# Patient Record
Sex: Female | Born: 2005 | Race: White | Hispanic: Yes | Marital: Single | Smoking: Never smoker
Health system: Southern US, Community
[De-identification: ages and names within clinical notes are randomized; demographics above are authoritative.]

## PROBLEM LIST (undated history)

## (undated) DIAGNOSIS — T7840XA Allergy, unspecified, initial encounter: Secondary | ICD-10-CM

## (undated) DIAGNOSIS — J45909 Unspecified asthma, uncomplicated: Secondary | ICD-10-CM

## (undated) HISTORY — DX: Allergy, unspecified, initial encounter: T78.40XA

---
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
## 2024-04-28 ENCOUNTER — Ambulatory Visit: Admitting: Nurse Practitioner

## 2024-05-20 ENCOUNTER — Encounter: Payer: Self-pay | Admitting: Obstetrics & Gynecology

## 2024-05-22 ENCOUNTER — Telehealth: Payer: Self-pay

## 2024-05-22 ENCOUNTER — Ambulatory Visit (INDEPENDENT_AMBULATORY_CARE_PROVIDER_SITE_OTHER): Admitting: Nurse Practitioner

## 2024-05-22 ENCOUNTER — Encounter: Payer: Self-pay | Admitting: Nurse Practitioner

## 2024-05-22 VITALS — BP 116/76 | HR 73 | Temp 98.2°F | Ht 71.02 in | Wt 217.0 lb

## 2024-05-22 DIAGNOSIS — J45909 Unspecified asthma, uncomplicated: Secondary | ICD-10-CM | POA: Diagnosis not present

## 2024-05-22 DIAGNOSIS — R5383 Other fatigue: Secondary | ICD-10-CM

## 2024-05-22 DIAGNOSIS — Z1159 Encounter for screening for other viral diseases: Secondary | ICD-10-CM

## 2024-05-22 DIAGNOSIS — Z114 Encounter for screening for human immunodeficiency virus [HIV]: Secondary | ICD-10-CM | POA: Diagnosis not present

## 2024-05-22 DIAGNOSIS — J45990 Exercise induced bronchospasm: Secondary | ICD-10-CM

## 2024-05-22 DIAGNOSIS — J189 Pneumonia, unspecified organism: Secondary | ICD-10-CM

## 2024-05-22 MED ORDER — CETIRIZINE HCL 10 MG PO TABS: 10.0000 mg | ORAL_TABLET | Freq: Every day | ORAL | 1 refills | Status: AC

## 2024-05-22 MED ORDER — ALBUTEROL SULFATE (SENSOR) 108 (90 BASE) MCG/ACT IN AEPB: 2.0000 | INHALATION_SPRAY | RESPIRATORY_TRACT | 3 refills | Status: AC | PRN

## 2024-05-22 MED ORDER — BUDESONIDE-FORMOTEROL FUMARATE 160-4.5 MCG/ACT IN AERO: 2.0000 | INHALATION_SPRAY | Freq: Two times a day (BID) | RESPIRATORY_TRACT | 3 refills | Status: AC

## 2024-05-22 MED ORDER — MONTELUKAST SODIUM 10 MG PO TABS: 10.0000 mg | ORAL_TABLET | Freq: Every day | ORAL | 1 refills | Status: AC

## 2024-05-22 MED ORDER — FLUTICASONE PROPIONATE 50 MCG/ACT NA SUSP: 1.0000 | Freq: Every day | NASAL | 3 refills | Status: AC

## 2024-05-22 NOTE — Progress Notes (Signed)
 New Patient Office Visit  Subjective   Patient ID: Ann Patrick, female    DOB: Jun 30, 2006  Age: 18 y.o. MRN: [id number]  CC:  Chief Complaint  Patient presents with   Establish Care    HPI Ann Patrick presents to establish care accompanied by her mom.  She plays volleyball and is attending college in [place] .  She has a history of exercise-induced asthma and allergies.  She is followed by [facility] in [place].  She has hypersensitivity to motion and experience motion sickness in the car.    She patient would like to have sports physical completed.  She has a history of pneumonia few days ago.  Has been taking antibiotic and would like to get a repeat chest x-ray.  She has been experiencing fatigue and tiredness and would like to get blood work done.   Health Maintenance  Topic Date Due   Pneumococcal Vaccine 34-56 Years old (2 of 2 - PPSV23, PCV20, or PCV21) 01/30/2011   COVID-19 Vaccine (4 - 2024-25 season) 07/28/2023   Hepatitis B Vaccines (1 of 3 - 19+ 3-dose series) Never done   INFLUENZA VACCINE  06/26/2024   DTaP/Tdap/Td (8 - Td or Tdap) 03/20/2033   HPV VACCINES  Completed   Hepatitis C Screening  Completed   HIV Screening  Completed   Meningococcal B Vaccine  Completed       Topic Date Due   Hepatitis B Vaccines (1 of 3 - 19+ 3-dose series) Never done    Outpatient Encounter Medications as of 05/22/2024  Medication Sig   albuterol  (PROVENTIL ) (2.5 MG/3ML) 0.083% nebulizer solution Inhale 2.5 mg into the lungs.   montelukast  (SINGULAIR ) 10 MG tablet Take 1 tablet (10 mg total) by mouth at bedtime.   Norethindrone Acetate-Ethinyl Estrad-FE (LOESTRIN 24 FE) 1-20 MG-MCG(24) tablet Take 1 tablet by mouth daily.   [DISCONTINUED] Albuterol  Sulfate, sensor, 108 (90 Base) MCG/ACT AEPB Inhale into the lungs as needed.   [DISCONTINUED] budesonide -formoterol  (SYMBICORT ) 160-4.5 MCG/ACT inhaler Inhale 2 puffs into the lungs 2 (two) times daily.    [DISCONTINUED] cetirizine  (ZYRTEC ) 10 MG tablet Take 10 mg by mouth daily.   [DISCONTINUED] fluticasone  (FLONASE ) 50 MCG/ACT nasal spray Place into both nostrils daily.   [DISCONTINUED] montelukast  (SINGULAIR ) 5 MG chewable tablet Chew by mouth.   Albuterol  Sulfate, sensor, 108 (90 Base) MCG/ACT AEPB Inhale 2 puffs into the lungs as needed.   budesonide -formoterol  (SYMBICORT ) 160-4.5 MCG/ACT inhaler Inhale 2 puffs into the lungs 2 (two) times daily.   cetirizine  (ZYRTEC ) 10 MG tablet Take 1 tablet (10 mg total) by mouth daily.   fluticasone  (FLONASE ) 50 MCG/ACT nasal spray Place 1 spray into both nostrils daily.   No facility-administered encounter medications on file as of 05/22/2024.    Past Medical History:  Diagnosis Date   Allergies    Allergy    Asthma     History reviewed. No pertinent surgical history.  Family History  Problem Relation Age of Onset   Hyperlipidemia Mother    Autoimmune disease Mother    Hypertension Father    Diabetes Maternal Grandmother    Diabetes Maternal Grandfather    Breast cancer Paternal Grandmother    Cancer Paternal Grandfather     Social History   Socioeconomic History   Marital status: Single    Spouse name: Not on file   Number of children: Not on file   Years of education: Not on file   Highest education level: Not on file  Occupational History   Not on file  Tobacco Use   Smoking status: Never   Smokeless tobacco: Never  Vaping Use   Vaping status: Never Used  Substance and Sexual Activity   Alcohol use: Yes    Alcohol/week: 2.0 standard drinks of alcohol    Types: 2 Standard drinks or equivalent per week   Drug use: Never   Sexual activity: Not Currently    Birth control/protection: None  Other Topics Concern   Not on file  Social History Narrative   Not on file   Social Drivers of Health   Financial Resource Strain: Low Risk  (05/14/2024)   Received from [facility]   Overall Financial Resource  Strain (CARDIA)    Difficulty of Paying Living Expenses: Not hard at all  Food Insecurity: No Food Insecurity (05/14/2024)   Received from [facility]   Hunger Vital Sign    Within the past 12 months, you worried that your food would run out before you got the money to buy more.: Never true    Within the past 12 months, the food you bought just didn't last and you didn't have money to get more.: Never true  Transportation Needs: No Transportation Needs (05/14/2024)   Received from [facility] - Transportation    In the past 12 months, has lack of transportation kept you from medical appointments or from getting medications?: No    Lack of Transportation (Non-Medical): No  Physical Activity: Sufficiently Active (05/16/2023)   Exercise Vital Sign    Days of Exercise per Week: 4 days    Minutes of Exercise per Session: 50 min  Stress: No Stress Concern Present (05/16/2023)   [employer] of Occupational Health - Occupational Stress Questionnaire    Feeling of Stress : Only a little  Social Connections: Moderately Integrated (05/16/2023)   Social Connection and Isolation Panel    Frequency of Communication with Friends and Family: Three times a week    Frequency of Social Gatherings with Friends and Family: More than three times a week    Attends Religious Services: More than 4 times per year    Active Member of [employer] or Organizations: Yes    Attends Banker Meetings: Never    Marital Status: Never married  Intimate Partner Violence: Not At Risk (05/16/2023)   Humiliation, Afraid, Rape, and Kick questionnaire    Fear of Current or Ex-Partner: No    Emotionally Abused: No    Physically Abused: No    Sexually Abused: No    ROS Negative unless indicated in HPI.      Objective    BP 116/76   Pulse 73   Temp 98.2 F (36.8 C)   Ht 5' 11.02 (1.804 m)   Wt 217 lb (98.4 kg)   LMP 05/19/2024   SpO2 98%   BMI 30.25 kg/m    Physical Exam Constitutional:      Appearance: Normal appearance.  HENT:     Right Ear: Tympanic membrane normal.     Left Ear: Tympanic membrane normal.     Mouth/Throat:     Mouth: Mucous membranes are moist.  Eyes:     Conjunctiva/sclera: Conjunctivae normal.     Pupils: Pupils are equal, round, and reactive to light.  Cardiovascular:     Rate and Rhythm: Normal rate and regular rhythm.     Pulses: Normal pulses.     Heart sounds: Normal heart sounds.  Pulmonary:     Effort: Pulmonary effort is normal.     Breath sounds: Normal breath sounds.  Abdominal:     General: Bowel sounds are normal.     Palpations: Abdomen is soft.  Musculoskeletal:     Cervical back: Normal range of motion. No tenderness.  Skin:    General: Skin is warm.     Findings: No bruising.  Neurological:     General: No focal deficit present.     Mental Status: She is alert and oriented to person, place, and time. Mental status is at baseline.  Psychiatric:        Mood and Affect: Mood normal.        Behavior: Behavior normal.        Thought Content: Thought content normal.        Judgment: Judgment normal.         Assessment & Plan:  Asthma due to seasonal allergies Assessment & Plan: She is followed by allergy and asthma clinic. -Continue Flonase  nasal spray, montelukast  and cetirizine  as needed   Other fatigue Assessment & Plan: Will check labs as outlined.  Orders: -     CBC with Differential/Platelet -     Comprehensive metabolic panel with GFR -     Lipid panel -     TSH -     Vitamin B12 -     VITAMIN D  25 Hydroxy (Vit-D Deficiency, Fractures)  Encounter for screening for HIV -     HIV Antibody (routine testing w rflx)  Need for hepatitis C screening test -     Hepatitis C antibody  Community acquired pneumonia of right upper lobe of lung Assessment & Plan: Was diagnosed with community-acquired pneumonia on 6/19.  On doxycycline. - Patient states she is doing  better. - Will repeat chest x-ray in a week.  Orders: -     DG Chest 2 View; Future  Exercise-induced asthma Assessment & Plan: Chronic, stable. - Controlled with Symbicort  albuterol  inhaler.    Other orders -     Fluticasone  Propionate; Place 1 spray into both nostrils daily.  Dispense: 9.9 mL; Refill: 3 -     Cetirizine  HCl; Take 1 tablet (10 mg total) by mouth daily.  Dispense: 90 tablet; Refill: 1 -     Budesonide -Formoterol  Fumarate; Inhale 2 puffs into the lungs 2 (two) times daily.  Dispense: 1 each; Refill: 3 -     Albuterol  Sulfate (sensor); Inhale 2 puffs into the lungs as needed.  Dispense: 1 each; Refill: 3 -     Montelukast  Sodium; Take 1 tablet (10 mg total) by mouth at bedtime.  Dispense: 90 tablet; Refill: 1    No follow-ups on file.   Shanicqua Coldren, NP

## 2024-05-22 NOTE — Patient Instructions (Signed)
 Please go to the lab for blood work. Sports physical completed.

## 2024-05-22 NOTE — Telephone Encounter (Signed)
 Requested records from [facility] per Chelsea Aurora.

## 2024-05-23 LAB — LIPID PANEL
Cholesterol: 158 mg/dL (ref <170)
HDL: 61 mg/dL (ref >45)
LDL Cholesterol (Calc): 76 mg/dL (ref <110)
Non-HDL Cholesterol (Calc): 97 mg/dL (ref <120)
Total CHOL/HDL Ratio: 2.6 (calc) (ref <5.0)
Triglycerides: 129 mg/dL — ABNORMAL HIGH (ref <90)

## 2024-05-23 LAB — CBC WITH DIFFERENTIAL/PLATELET
Absolute Lymphocytes: 5201 {cells}/uL — ABNORMAL HIGH (ref 850–3900)
Absolute Monocytes: 766 {cells}/uL (ref 200–950)
Basophils Absolute: 66 {cells}/uL (ref 0–200)
Basophils Relative: 0.5 %
Eosinophils Absolute: 317 {cells}/uL (ref 15–500)
Eosinophils Relative: 2.4 %
HCT: 44.4 % (ref 35.0–45.0)
Hemoglobin: 14.3 g/dL (ref 11.7–15.5)
MCH: 27 pg (ref 27.0–33.0)
MCHC: 32.2 g/dL (ref 32.0–36.0)
MCV: 83.8 fL (ref 80.0–100.0)
MPV: 10 fL (ref 7.5–12.5)
Monocytes Relative: 5.8 %
Neutro Abs: 6851 {cells}/uL (ref 1500–7800)
Neutrophils Relative %: 51.9 %
Platelets: 369 10*3/uL (ref 140–400)
RBC: 5.3 10*6/uL — ABNORMAL HIGH (ref 3.80–5.10)
RDW: 12.8 % (ref 11.0–15.0)
Total Lymphocyte: 39.4 %
WBC: 13.2 10*3/uL — ABNORMAL HIGH (ref 3.8–10.8)

## 2024-05-23 LAB — COMPREHENSIVE METABOLIC PANEL WITH GFR
AG Ratio: 1.4 (calc) (ref 1.0–2.5)
ALT: 19 U/L (ref 5–32)
AST: 13 U/L (ref 12–32)
Albumin: 4.2 g/dL (ref 3.6–5.1)
Alkaline phosphatase (APISO): 96 U/L (ref 36–128)
BUN: 19 mg/dL (ref 7–20)
CO2: 25 mmol/L (ref 20–32)
Calcium: 9.8 mg/dL (ref 8.9–10.4)
Chloride: 101 mmol/L (ref 98–110)
Creat: 0.72 mg/dL (ref 0.50–0.96)
Globulin: 3 g/dL (ref 2.0–3.8)
Glucose, Bld: 72 mg/dL (ref 65–99)
Potassium: 4 mmol/L (ref 3.8–5.1)
Sodium: 137 mmol/L (ref 135–146)
Total Bilirubin: 0.6 mg/dL (ref 0.2–1.1)
Total Protein: 7.2 g/dL (ref 6.3–8.2)
eGFR: 123 mL/min/{1.73_m2} (ref >=60)

## 2024-05-23 LAB — HIV ANTIBODY (ROUTINE TESTING W REFLEX): HIV 1&2 Ab, 4th Generation: NONREACTIVE

## 2024-05-23 LAB — VITAMIN B12: Vitamin B-12: 852 pg/mL (ref 200–1100)

## 2024-05-23 LAB — HEPATITIS C ANTIBODY: Hepatitis C Ab: NONREACTIVE

## 2024-05-23 LAB — TSH: TSH: 1.49 m[IU]/L

## 2024-05-23 LAB — VITAMIN D 25 HYDROXY (VIT D DEFICIENCY, FRACTURES): Vit D, 25-Hydroxy: 58 ng/mL (ref 30–100)

## 2024-06-01 ENCOUNTER — Ambulatory Visit (INDEPENDENT_AMBULATORY_CARE_PROVIDER_SITE_OTHER)

## 2024-06-01 ENCOUNTER — Telehealth: Payer: Self-pay

## 2024-06-01 ENCOUNTER — Other Ambulatory Visit

## 2024-06-01 DIAGNOSIS — J189 Pneumonia, unspecified organism: Secondary | ICD-10-CM | POA: Diagnosis not present

## 2024-06-01 NOTE — Telephone Encounter (Signed)
 Copied from CRM [id number]. Topic: Appointments - Scheduling Inquiry for Clinic >> Jun 01, 2024  9:44 AM Turkey A wrote: Reason for CRM: Patient called to reschedule Chest X-Ray for today-please contact

## 2024-06-01 NOTE — Telephone Encounter (Signed)
 LVM for pt to give office a call back. Okay to reschedule pt under lab for xray

## 2024-06-08 DIAGNOSIS — J189 Pneumonia, unspecified organism: Secondary | ICD-10-CM | POA: Insufficient documentation

## 2024-06-08 DIAGNOSIS — R5383 Other fatigue: Secondary | ICD-10-CM | POA: Insufficient documentation

## 2024-06-08 DIAGNOSIS — J45909 Unspecified asthma, uncomplicated: Secondary | ICD-10-CM | POA: Insufficient documentation

## 2024-06-08 DIAGNOSIS — J45990 Exercise induced bronchospasm: Secondary | ICD-10-CM | POA: Insufficient documentation

## 2024-06-08 NOTE — Assessment & Plan Note (Signed)
 Was diagnosed with community-acquired pneumonia on 6/19.  On doxycycline. - Patient states she is doing better. - Will repeat chest x-ray in a week.

## 2024-06-08 NOTE — Assessment & Plan Note (Signed)
 Will check labs as outlined.

## 2024-06-08 NOTE — Assessment & Plan Note (Signed)
 She is followed by allergy and asthma clinic. -Continue Flonase  nasal spray, montelukast  and cetirizine  as needed

## 2024-06-08 NOTE — Assessment & Plan Note (Signed)
 Chronic, stable. - Controlled with Symbicort  albuterol  inhaler.

## 2024-06-11 ENCOUNTER — Other Ambulatory Visit: Payer: Self-pay | Admitting: Nurse Practitioner

## 2024-06-11 ENCOUNTER — Ambulatory Visit: Payer: Self-pay | Admitting: Nurse Practitioner

## 2024-06-11 DIAGNOSIS — R7989 Other specified abnormal findings of blood chemistry: Secondary | ICD-10-CM

## 2024-06-25 ENCOUNTER — Encounter: Payer: Self-pay | Admitting: Nurse Practitioner

## 2024-06-25 ENCOUNTER — Ambulatory Visit (INDEPENDENT_AMBULATORY_CARE_PROVIDER_SITE_OTHER): Admitting: Nurse Practitioner

## 2024-06-25 VITALS — BP 116/74 | HR 76 | Temp 98.2°F | Ht 71.02 in | Wt 214.8 lb

## 2024-06-25 DIAGNOSIS — J029 Acute pharyngitis, unspecified: Secondary | ICD-10-CM

## 2024-06-25 DIAGNOSIS — R7989 Other specified abnormal findings of blood chemistry: Secondary | ICD-10-CM

## 2024-06-25 LAB — POCT RAPID STREP A (OFFICE): Rapid Strep A Screen: NEGATIVE

## 2024-06-25 MED ORDER — PREDNISONE 10 MG PO TABS: 10.0000 mg | ORAL_TABLET | Freq: Every day | ORAL | 0 refills | Status: AC

## 2024-06-25 NOTE — Progress Notes (Signed)
 Established Patient Office Visit  Subjective:  Patient ID: Ann Patrick, female    DOB: 19-Mar-2006  Age: 18 y.o. MRN: [id number]  CC:  Chief Complaint  Patient presents with   Acute Visit    Sore throat x 1 week   Discussed the use of a AI scribe software for clinical note transcription with the patient, who gave verbal consent to proceed.  HPI  Ann Patrick presents for  Sore Throat  This is a new problem. The current episode started in the past 7 days. The problem has been unchanged. There has been no fever. Associated symptoms include coughing. Pertinent negatives include no ear discharge or plugged ear sensation. Treatments tried: cold and flu med.     Past Medical History:  Diagnosis Date   Allergies    Allergy    Asthma     History reviewed. No pertinent surgical history.  Family History  Problem Relation Age of Onset   Hyperlipidemia Mother    Autoimmune disease Mother    Hypertension Father    Diabetes Maternal Grandmother    Diabetes Maternal Grandfather    Breast cancer Paternal Grandmother    Cancer Paternal Grandfather     Social History   Socioeconomic History   Marital status: Single    Spouse name: Not on file   Number of children: Not on file   Years of education: Not on file   Highest education level: Not on file  Occupational History   Not on file  Tobacco Use   Smoking status: Never   Smokeless tobacco: Never  Vaping Use   Vaping status: Never Used  Substance and Sexual Activity   Alcohol use: Yes    Alcohol/week: 2.0 standard drinks of alcohol    Types: 2 Standard drinks or equivalent per week   Drug use: Never   Sexual activity: Not Currently    Birth control/protection: None  Other Topics Concern   Not on file  Social History Narrative   Not on file   Social Drivers of Health   Financial Resource Strain: Low Risk  (05/14/2024)   Received from [facility]   Overall Financial Resource Strain (CARDIA)     Difficulty of Paying Living Expenses: Not hard at all  Food Insecurity: No Food Insecurity (05/14/2024)   Received from [facility]   Hunger Vital Sign    Within the past 12 months, you worried that your food would run out before you got the money to buy more.: Never true    Within the past 12 months, the food you bought just didn't last and you didn't have money to get more.: Never true  Transportation Needs: No Transportation Needs (05/14/2024)   Received from [facility] - Transportation    In the past 12 months, has lack of transportation kept you from medical appointments or from getting medications?: No    Lack of Transportation (Non-Medical): No  Physical Activity: Sufficiently Active (05/16/2023)   Exercise Vital Sign    Days of Exercise per Week: 4 days    Minutes of Exercise per Session: 50 min  Stress: No Stress Concern Present (05/16/2023)   [employer] of Occupational Health - Occupational Stress Questionnaire    Feeling of Stress : Only a little  Social Connections: Moderately Integrated (05/16/2023)   Social Connection and Isolation Panel    Frequency of Communication with Friends and Family: Three times a week    Frequency of  Social Gatherings with Friends and Family: More than three times a week    Attends Religious Services: More than 4 times per year    Active Member of [employer] or Organizations: Yes    Attends Banker Meetings: Never    Marital Status: Never married  Intimate Partner Violence: Not At Risk (05/16/2023)   Humiliation, Afraid, Rape, and Kick questionnaire    Fear of Current or Ex-Partner: No    Emotionally Abused: No    Physically Abused: No    Sexually Abused: No     Outpatient Medications Prior to Visit  Medication Sig Dispense Refill   albuterol  (PROVENTIL ) (2.5 MG/3ML) 0.083% nebulizer solution Inhale 2.5 mg into the lungs.     Albuterol  Sulfate, sensor, 108 (90 Base) MCG/ACT AEPB  Inhale 2 puffs into the lungs as needed. 1 each 3   budesonide -formoterol  (SYMBICORT ) 160-4.5 MCG/ACT inhaler Inhale 2 puffs into the lungs 2 (two) times daily. 1 each 3   cetirizine  (ZYRTEC ) 10 MG tablet Take 1 tablet (10 mg total) by mouth daily. 90 tablet 1   fluticasone  (FLONASE ) 50 MCG/ACT nasal spray Place 1 spray into both nostrils daily. 9.9 mL 3   montelukast  (SINGULAIR ) 10 MG tablet Take 1 tablet (10 mg total) by mouth at bedtime. 90 tablet 1   Norethindrone Acetate-Ethinyl Estrad-FE (LOESTRIN 24 FE) 1-20 MG-MCG(24) tablet Take 1 tablet by mouth daily. 90 tablet 4   No facility-administered medications prior to visit.    Allergies  Allergen Reactions   Oyster Extract Anaphylaxis   Peanut (Diagnostic) Anaphylaxis, Itching and Swelling   Clam Shell    Other     Lobster, lima beans    ROS Review of Systems  HENT:  Negative for ear discharge.   Respiratory:  Positive for cough.    Negative unless indicated in HPI.    Objective:    Physical Exam  BP 116/74   Pulse 76   Temp 98.2 F (36.8 C)   Ht 5' 11.02 (1.804 m)   Wt 214 lb 12.8 oz (97.4 kg)   SpO2 94%   BMI 29.94 kg/m  Wt Readings from Last 3 Encounters:  06/25/24 214 lb 12.8 oz (97.4 kg) (98%, Z= 2.14)*  05/22/24 217 lb (98.4 kg) (99%, Z= 2.17)*  10/28/23 207 lb (93.9 kg) (98%, Z= 2.05)*   * Growth percentiles are based on CDC (Girls, 2-20 Years) data.     Health Maintenance  Topic Date Due   Pneumococcal Vaccine: 19-49 Years (2 of 2 - PPSV23, PCV20, or PCV21) 01/30/2011   COVID-19 Vaccine (4 - 2024-25 season) 07/28/2023   Hepatitis B Vaccines (1 of 3 - 19+ 3-dose series) Never done   INFLUENZA VACCINE  06/26/2024   DTaP/Tdap/Td (8 - Td or Tdap) 03/20/2033   HPV VACCINES  Completed   Hepatitis C Screening  Completed   HIV Screening  Completed   Meningococcal B Vaccine  Completed       Topic Date Due   Hepatitis B Vaccines (1 of 3 - 19+ 3-dose series) Never done    Lab Results  Component  Value Date   TSH 1.49 05/22/2024   Lab Results  Component Value Date   WBC 11.4 (H) 06/25/2024   HGB 13.5 06/25/2024   HCT 41.3 06/25/2024   MCV 86 06/25/2024   PLT 328 06/25/2024   Lab Results  Component Value Date   NA 137 05/22/2024   K 4.0 05/22/2024   CO2 25 05/22/2024   GLUCOSE 72  05/22/2024   BUN 19 05/22/2024   CREATININE 0.72 05/22/2024   BILITOT 0.6 05/22/2024   ALKPHOS 81 12/23/2021   AST 13 05/22/2024   ALT 19 05/22/2024   PROT 7.2 05/22/2024   ALBUMIN 4.8 12/23/2021   CALCIUM 9.8 05/22/2024   ANIONGAP 10 12/23/2021   EGFR 123 05/22/2024   Lab Results  Component Value Date   CHOL 158 05/22/2024   Lab Results  Component Value Date   HDL 61 05/22/2024   Lab Results  Component Value Date   LDLCALC 76 05/22/2024   Lab Results  Component Value Date   TRIG 129 (H) 05/22/2024   Lab Results  Component Value Date   CHOLHDL 2.6 05/22/2024   No results found for: HGBA1C    Assessment & Plan:  Sore throat Assessment & Plan: Sore throat with dry cough, likely viral. Strep negative. - Prescribe prednisone  10 mg for 5 days. - Advise saltwater gargles. - Order CBC to check WBC resolution.  Orders: -     POCT rapid strep A  Abnormal CBC -     CBC with Differential/Platelet  Other orders -     predniSONE ; Take 1 tablet (10 mg total) by mouth daily with breakfast.  Dispense: 5 tablet; Refill: 0    Follow-up: No follow-ups on file.   Tascha Casares, NP

## 2024-06-26 LAB — CBC WITH DIFFERENTIAL/PLATELET
Basophils Absolute: 0.1 x10E3/uL (ref 0.0–0.2)
Basos: 1 %
EOS (ABSOLUTE): 0.4 x10E3/uL (ref 0.0–0.4)
Eos: 4 %
Hematocrit: 41.3 % (ref 34.0–46.6)
Hemoglobin: 13.5 g/dL (ref 11.1–15.9)
Immature Grans (Abs): 0 x10E3/uL (ref 0.0–0.1)
Immature Granulocytes: 0 %
Lymphocytes Absolute: 3.5 x10E3/uL — ABNORMAL HIGH (ref 0.7–3.1)
Lymphs: 30 %
MCH: 28 pg (ref 26.6–33.0)
MCHC: 32.7 g/dL (ref 31.5–35.7)
MCV: 86 fL (ref 79–97)
Monocytes Absolute: 0.7 x10E3/uL (ref 0.1–0.9)
Monocytes: 6 %
Neutrophils Absolute: 6.8 x10E3/uL (ref 1.4–7.0)
Neutrophils: 59 %
Platelets: 328 x10E3/uL (ref 150–450)
RBC: 4.82 x10E6/uL (ref 3.77–5.28)
RDW: 12.8 % (ref 11.7–15.4)
WBC: 11.4 x10E3/uL — ABNORMAL HIGH (ref 3.4–10.8)

## 2024-07-06 ENCOUNTER — Ambulatory Visit: Payer: Self-pay | Admitting: Nurse Practitioner

## 2024-07-06 DIAGNOSIS — J029 Acute pharyngitis, unspecified: Secondary | ICD-10-CM | POA: Insufficient documentation

## 2024-07-06 NOTE — Assessment & Plan Note (Signed)
 Sore throat with dry cough, likely viral. Strep negative. - Prescribe prednisone  10 mg for 5 days. - Advise saltwater gargles. - Order CBC to check WBC resolution.

## 2024-12-22 ENCOUNTER — Ambulatory Visit: Admitting: Obstetrics & Gynecology

## 2024-12-22 ENCOUNTER — Encounter: Payer: Self-pay | Admitting: Obstetrics & Gynecology

## 2024-12-22 VITALS — BP 121/81 | HR 80 | Ht 72.0 in | Wt 210.6 lb

## 2024-12-22 DIAGNOSIS — Z30011 Encounter for initial prescription of contraceptive pills: Secondary | ICD-10-CM

## 2024-12-22 DIAGNOSIS — L709 Acne, unspecified: Secondary | ICD-10-CM | POA: Diagnosis not present

## 2024-12-22 MED ORDER — DROSPIRENONE-ETHINYL ESTRADIOL 3-0.02 MG PO TABS: 1.0000 | ORAL_TABLET | Freq: Every day | ORAL | 4 refills | Status: AC

## 2024-12-22 NOTE — Progress Notes (Signed)
" ° °  GYN VISIT Patient name: Ann Patrick MRN [id number]  Date of birth: May 23, 2006 Chief Complaint:   Contraception  History of Present Illness:   Ann Patrick is a 19 y.o. G0P0000  female being seen today for follow up regarding:  -Last visit started on Loloestrin back in Dec 2024- on medication up until May, but noted worsening acne and discontinued this medication. Since June she continues to struggle with acne and wishes to review alternative forms of contraception.    Menses are regular each month- typically 5-6 days.  Denies significant dysmenorrhea or intermenstrual bleeding  Currently using condoms for contraception  Patient's last menstrual period was 12/07/2024 (exact date).    Review of Systems:   Pertinent items are noted in HPI Denies fever/chills, dizziness, headaches, visual disturbances, fatigue, shortness of breath, chest pain, abdominal pain, vomiting, no problems with periods, bowel movements, urination, or intercourse unless otherwise stated above.  Pertinent History Reviewed:  History reviewed. No pertinent surgical history.  Past Medical History:  Diagnosis Date   Allergies    Allergy    Asthma    Reviewed problem list, medications and allergies. Physical Assessment:   Vitals:   12/22/24 1031  BP: 121/81  Pulse: 80  Weight: 210 lb 9.6 oz (95.5 kg)  Height: 6' (1.829 m)  Body mass index is 28.56 kg/m.       Physical Examination:   General appearance: alert, well appearing, and in no distress  Psych: mood appropriate, normal affect  Skin: warm & dry, +acne noted mostly lower face and chin  Cardiovascular: normal heart rate noted  Respiratory: normal respiratory effort, no distress  Extremities: no edema   Chaperone: N/A    Assessment & Plan:  1) Contraceptive management/Acne -reviewed options ranging from trial of alternative pill, Depot or LARCs -plan for Yaz -recommend dermatology- referral not indicated.  Advised pt to call office close to  home- [place]  Meds ordered this encounter  Medications   drospirenone -ethinyl estradiol  (YAZ) 3-0.02 MG tablet    Sig: Take 1 tablet by mouth daily.    Dispense:  90 tablet    Refill:  4      No orders of the defined types were placed in this encounter.   Return in about 1 year (around 12/22/2025) for Annual.   Callia Swim, DO Attending Obstetrician & Gynecologist, [facility] for [facility], [facility]    "

## 2024-12-30 ENCOUNTER — Ambulatory Visit: Admitting: Obstetrics & Gynecology
